# Patient Record
Sex: Female | Born: 1995
Health system: Southern US, Community
[De-identification: ages and names within clinical notes are randomized; demographics above are authoritative.]

## PROBLEM LIST (undated history)

## (undated) DIAGNOSIS — G43909 Migraine, unspecified, not intractable, without status migrainosus: Secondary | ICD-10-CM

## (undated) HISTORY — PX: WISDOM TOOTH EXTRACTION: SHX21

---
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
## 2016-11-13 MED FILL — NUVARING VAGINAL RING: 0.12-0.015 | 84 days supply | Qty: 3 | Fill #0

## 2016-11-18 DIAGNOSIS — Z01419 Encounter for gynecological examination (general) (routine) without abnormal findings: Secondary | ICD-10-CM | POA: Diagnosis not present

## 2016-11-18 DIAGNOSIS — Z6821 Body mass index (BMI) 21.0-21.9, adult: Secondary | ICD-10-CM | POA: Diagnosis not present

## 2016-12-30 DIAGNOSIS — Z0000 Encounter for general adult medical examination without abnormal findings: Secondary | ICD-10-CM | POA: Diagnosis not present

## 2017-03-03 MED FILL — NUVARING VAGINAL RING: 0.12-0.015 | 90 days supply | Qty: 3 | Fill #0

## 2017-05-26 MED FILL — NUVARING VAGINAL RING: 0.12-0.015 | 90 days supply | Qty: 3 | Fill #1

## 2017-08-12 NOTE — Progress Notes (Deleted)
[facility], [address] [place], [place] [address] [phone]  Date:  08/14/2017   Name:  Carly Key   DOB:  1996/01/20   MRN:  [id number]  PCP:  Aggie Hacker, MD    Chief Complaint: No chief complaint on file.   History of Present Illness:  Carly Key is a 22 y.o. very pleasant female patient who presents with the following:  Here today as a new patient to establish care No recent primary care notes in chart   Pap: Labs:    Patient Active Problem List   Diagnosis Date Noted  . Low back pain 10/21/2013    No past medical history on file.  Past Surgical History:  Procedure Laterality Date  . WISDOM TOOTH EXTRACTION      Social History   Tobacco Use  . Smoking status: Never Smoker  . Smokeless tobacco: Never Used  Substance Use Topics  . Alcohol use: No  . Drug use: No    Family History  Problem Relation Age of Onset  . Diabetes Other   . Diabetes Other     No Known Allergies  Medication list has been reviewed and updated.  Current Outpatient Medications on File Prior to Visit  Medication Sig Dispense Refill  . ibuprofen (ADVIL,MOTRIN) 200 MG tablet Take 200 mg by mouth every 6 (six) hours as needed.     No current facility-administered medications on file prior to visit.     Review of Systems:  As per HPI- otherwise negative.   Physical Examination: There were no vitals filed for this visit. There were no vitals filed for this visit. There is no height or weight on file to calculate BMI. Ideal Body Weight:    GEN: WDWN, NAD, Non-toxic, A & O x 3 HEENT: Atraumatic, Normocephalic. Neck supple. No masses, No LAD. Ears and Nose: No external deformity. CV: RRR, No M/G/R. No JVD. No thrill. No extra heart sounds. PULM: CTA B, no wheezes, crackles, rhonchi. No retractions. No resp. distress. No accessory muscle use. ABD: S, NT, ND, +BS. No rebound. No HSM. EXTR: No c/c/e NEURO  Normal gait.  PSYCH: Normally interactive. Conversant. Not depressed or anxious appearing.  Calm demeanor.    Assessment and Plan: ***  Signed Abbe Amsterdam, MD

## 2017-08-13 MED FILL — NUVARING VAGINAL RING: 0.12-0.015 | 90 days supply | Qty: 3 | Fill #2

## 2017-08-14 ENCOUNTER — Ambulatory Visit: Payer: No Typology Code available for payment source | Admitting: Family Medicine

## 2017-08-14 ENCOUNTER — Telehealth: Payer: Self-pay | Admitting: General Practice

## 2017-08-14 DIAGNOSIS — Z0289 Encounter for other administrative examinations: Secondary | ICD-10-CM

## 2017-08-14 NOTE — Telephone Encounter (Signed)
DO NOT SCHEDULE NEW PATIENT APPOINTMENT WITH J. COPLAND LPBC-SW

## 2017-10-31 MED FILL — NUVARING VAGINAL RING: 0.12-0.015 | 90 days supply | Qty: 3 | Fill #3

## 2018-01-28 MED FILL — NUVARING VAGINAL RING: 0.12-0.015 | 90 days supply | Qty: 3 | Fill #0

## 2018-03-26 ENCOUNTER — Telehealth: Payer: Self-pay

## 2018-03-26 NOTE — Telephone Encounter (Signed)
Author phoned pt. To notify her about new patient appointment scheduled per Dr. Cyndie Chimeopland's approval, no answer, unable to leave VM. Routed to scheduler to re-attempt contact.

## 2018-03-29 NOTE — Progress Notes (Addendum)
[facility] [address], [address] [place], [place] [address] [phone] [phone]  Date:  03/30/2018   Name:  Carly PhoChesney Fitting   DOB:  01/26/1996   MRN:  [id number]  PCP:  Pearline Cablesopland, Jessica C, MD    Chief Complaint: New Patient (Initial Visit) (no concerns, flu shot)   History of Present Illness:  Carly Key is a 22 y.o. 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 is studying anthropology and is applying to physical therapy school  She is very busy with school and track, and she plays the violin as well She has 2 sibs In her free time she enjoys reading, art, spending time with her friends  She has been generally healthy over her life  She has no real concerns today  We can get routine labs for her She did get a pap per her GYN, normal  Her immun are UTD, did gardasil  She only had 1 dose of meningitis B, but declines a booster today She no longer lives in the dorm Flu shot today    We noted that her pulse is a bit faster than I would expect for an athlete, she states that pulse in the 90s is normal for her.  She does monitor this on her Fitbit. No problems with athletic performance, no chest pain or shortness of breath, no syncope Patient Active Problem List   Diagnosis Date Noted  . Low back pain 10/21/2013    No past medical history on file.  Past Surgical History:  Procedure Laterality Date  . WISDOM TOOTH EXTRACTION      Social History   Tobacco Use  . Smoking status: Never Smoker  . Smokeless tobacco: Never Used  Substance Use Topics  . Alcohol use: Yes    Comment: occasinally  . Drug use: No    Family History  Problem Relation Age of Onset  . Diabetes Other   . Diabetes Other     No Known Allergies  Medication list has been reviewed  and updated.  Current Outpatient Medications on File Prior to Visit  Medication Sig Dispense Refill  . ibuprofen (ADVIL,MOTRIN) 200 MG tablet Take 200 mg by mouth every 6 (six) hours as needed.     No current facility-administered medications on file prior to visit.     Review of Systems:  As per HPI- otherwise negative. No CP or SOB   Physical Examination: Vitals:   03/30/18 1603  BP: 116/84  Pulse: (!) 108  Resp: 16  SpO2: 99%   Vitals:   03/30/18 1603  Weight: 119 lb (54 kg)  Height: 5\' 3"  (1.6 m)   Body mass index is 21.08 kg/m. Ideal Body Weight: Weight in (lb) to have BMI = 25: 140.8  GEN: WDWN, NAD, Non-toxic, A & O x 3, slim build, looks well HEENT: Atraumatic, Normocephalic. Neck supple. No masses, No LAD. Ears and Nose: No external deformity. CV: RRR, No M/G/R. No JVD. No thrill. No extra heart sounds. PULM: CTA B, no wheezes, crackles, rhonchi. No retractions. No resp. distress. No accessory muscle use. ABD: S, NT, ND, +BS. No rebound. No HSM. EXTR: No c/c/e NEURO Normal gait.  PSYCH: Normally interactive. Conversant. Not depressed or anxious appearing.  Calm demeanor.  Assessment and Plan: Screening for deficiency anemia - Plan: CBC  Screening for hyperlipidemia - Plan: Lipid panel  Screening for thyroid disorder - Plan: TSH  Screening for diabetes mellitus - Plan: Comprehensive metabolic panel  Need for influenza vaccination - Plan: Flu Vaccine QUAD 6+ mos PF IM (Fluarix Quad PF)  New patient to establish care today. Gave flu shot today. Obtain screening labs as above, will be in touch of these reports  Signed Abbe AmsterdamJessica Copland, MD  Received her labs, letter to pt 12/28 Results for orders placed or performed in visit on 03/30/18  CBC  Result Value Ref Range   WBC 7.3 4.0 - 10.5 K/uL   RBC 4.70 3.87 - 5.11 Mil/uL   Platelets 222.0 150.0 - 400.0 K/uL   Hemoglobin 15.2 (H) 12.0 - 15.0 g/dL   HCT 16.144.5 09.636.0 - 04.546.0 %   MCV 94.7 78.0 - 100.0  fl   MCHC 34.1 30.0 - 36.0 g/dL   RDW 40.913.0 81.111.5 - 91.415.5 %  Comprehensive metabolic panel  Result Value Ref Range   Sodium 139 135 - 145 mEq/L   Potassium 4.6 3.5 - 5.1 mEq/L   Chloride 103 96 - 112 mEq/L   CO2 26 19 - 32 mEq/L   Glucose, Bld 80 70 - 99 mg/dL   BUN 9 6 - 23 mg/dL   Creatinine, Ser 7.820.92 0.40 - 1.20 mg/dL   Total Bilirubin 0.7 0.2 - 1.2 mg/dL   Alkaline Phosphatase 49 39 - 117 U/L   AST 13 0 - 37 U/L   ALT 9 0 - 35 U/L   Total Protein 7.1 6.0 - 8.3 g/dL   Albumin 4.1 3.5 - 5.2 g/dL   Calcium 95.610.2 8.4 - 21.310.5 mg/dL   GFR 08.6598.43 >78.46>60.00 mL/min  Lipid panel  Result Value Ref Range   Cholesterol 195 0 - 200 mg/dL   Triglycerides 96.269.0 0.0 - 149.0 mg/dL   HDL 95.2871.80 >41.32>39.00 mg/dL   VLDL 44.013.8 0.0 - 10.240.0 mg/dL   LDL Cholesterol 725110 (H) 0 - 99 mg/dL   Total CHOL/HDL Ratio 3    NonHDL 123.43   TSH  Result Value Ref Range   TSH 1.02 0.35 - 4.50 uIU/mL

## 2018-03-30 ENCOUNTER — Ambulatory Visit (INDEPENDENT_AMBULATORY_CARE_PROVIDER_SITE_OTHER): Payer: No Typology Code available for payment source | Admitting: Family Medicine

## 2018-03-30 ENCOUNTER — Encounter: Payer: Self-pay | Admitting: Family Medicine

## 2018-03-30 VITALS — BP 116/84 | HR 90 | Resp 16 | Ht 63.0 in | Wt 119.0 lb

## 2018-03-30 DIAGNOSIS — Z131 Encounter for screening for diabetes mellitus: Secondary | ICD-10-CM

## 2018-03-30 DIAGNOSIS — Z23 Encounter for immunization: Secondary | ICD-10-CM

## 2018-03-30 DIAGNOSIS — Z1329 Encounter for screening for other suspected endocrine disorder: Secondary | ICD-10-CM | POA: Diagnosis not present

## 2018-03-30 DIAGNOSIS — Z130 Encounter for screening for diseases of the blood and blood-forming organs and certain disorders involving the immune mechanism: Secondary | ICD-10-CM | POA: Diagnosis not present

## 2018-03-30 DIAGNOSIS — Z13220 Encounter for screening for lipoid disorders: Secondary | ICD-10-CM | POA: Diagnosis not present

## 2018-03-30 NOTE — Patient Instructions (Signed)
It was certainly a pleasure to meet you today- have a wonderful holiday break  I will be in touch with your labs asap Your pulse rate is in normal range.

## 2018-03-31 LAB — COMPREHENSIVE METABOLIC PANEL
ALT: 9 U/L (ref 0–35)
AST: 13 U/L (ref 0–37)
Albumin: 4.1 g/dL (ref 3.5–5.2)
Alkaline Phosphatase: 49 U/L (ref 39–117)
BUN: 9 mg/dL (ref 6–23)
CALCIUM: 10.2 mg/dL (ref 8.4–10.5)
CHLORIDE: 103 meq/L (ref 96–112)
CO2: 26 meq/L (ref 19–32)
CREATININE: 0.92 mg/dL (ref 0.40–1.20)
GFR: 98.43 mL/min (ref >60.00)
Glucose, Bld: 80 mg/dL (ref 70–99)
POTASSIUM: 4.6 meq/L (ref 3.5–5.1)
Sodium: 139 mEq/L (ref 135–145)
Total Bilirubin: 0.7 mg/dL (ref 0.2–1.2)
Total Protein: 7.1 g/dL (ref 6.0–8.3)

## 2018-03-31 LAB — LIPID PANEL
CHOL/HDL RATIO: 3
Cholesterol: 195 mg/dL (ref 0–200)
HDL: 71.8 mg/dL (ref >39.00)
LDL CALC: 110 mg/dL — AB (ref 0–99)
NonHDL: 123.43
TRIGLYCERIDES: 69 mg/dL (ref 0.0–149.0)
VLDL: 13.8 mg/dL (ref 0.0–40.0)

## 2018-03-31 LAB — CBC
HCT: 44.5 % (ref 36.0–46.0)
Hemoglobin: 15.2 g/dL — ABNORMAL HIGH (ref 12.0–15.0)
MCHC: 34.1 g/dL (ref 30.0–36.0)
MCV: 94.7 fl (ref 78.0–100.0)
PLATELETS: 222 10*3/uL (ref 150.0–400.0)
RBC: 4.7 Mil/uL (ref 3.87–5.11)
RDW: 13 % (ref 11.5–15.5)
WBC: 7.3 10*3/uL (ref 4.0–10.5)

## 2018-03-31 LAB — TSH: TSH: 1.02 u[IU]/mL (ref 0.35–4.50)

## 2018-04-03 ENCOUNTER — Telehealth: Payer: Self-pay | Admitting: *Deleted

## 2018-04-03 NOTE — Telephone Encounter (Signed)
Received notice back that patient was not "an active patient" at [facility],  as a response to request for Medical Records; forwarded to provider/SLS 12/27

## 2018-04-11 ENCOUNTER — Telehealth: Payer: Self-pay | Admitting: Family Medicine

## 2018-04-11 NOTE — Telephone Encounter (Signed)
Error

## 2018-04-20 MED FILL — NUVARING VAGINAL RING: 0.12-0.015 | 90 days supply | Qty: 3 | Fill #1

## 2018-06-19 ENCOUNTER — Telehealth: Payer: No Typology Code available for payment source | Admitting: Family

## 2018-06-19 DIAGNOSIS — R509 Fever, unspecified: Secondary | ICD-10-CM | POA: Diagnosis not present

## 2018-06-19 NOTE — Progress Notes (Signed)
Greater than 5 minutes, yet less than 10 minutes of time have been spent researching, coordinating, and implementing care for this patient today.  Thank you for the details you included in the comment boxes. Those details are very helpful in determining the best course of treatment for you and help [place] to provide the best care.  Given that you only have a high fever and back pain, this could be numerous other health conditions including a kidney/bladder infection as well. Without the respiratory and other flu symptoms, this is less likely to be the flu. Please monitor this closely and let us know if you develop any of the respiratory symptoms common with the flu.Please carefully observe for any trouble with your bladder/bowels/urinary habits that would be concerning. If so, go to an urgent care ASAP.  E visit for Flu like symptoms   We are sorry that you are not feeling well.  Here is how we plan to help! Based on what you have shared with me it looks like you may have flu-like symptoms that should be watched but do not seem to indicate anti-viral treatment.  Influenza or "the flu" is   an infection caused by a respiratory virus. The flu virus is highly contagious and persons who did not receive their yearly flu vaccination may "catch" the flu from close contact.  We have anti-viral medications to treat the viruses that cause this infection. They are not a "cure" and only shorten the course of the infection. These prescriptions are most effective when they are given within the first 2 days of "flu" symptoms. Antiviral medication are indicated if you have a high risk of complications from the flu. You should  also consider an antiviral medication if you are in close contact with someone who is at risk. These medications can help patients avoid complications from the flu  but have side effects that you should know. Possible side effects from Tamiflu or oseltamivir include nausea, vomiting, diarrhea,  dizziness, headaches, eye redness, sleep problems or other respiratory symptoms. You should not take Tamiflu if you have an allergy to oseltamivir or any to the ingredients in Tamiflu.  Based upon your symptoms and potential risk factors I recommend that you follow the flu symptoms recommendation that I have listed below.  ANYONE WHO HAS FLU SYMPTOMS SHOULD: . Stay home. The flu is highly contagious and going out or to work exposes others! . Be sure to drink plenty of fluids. Water is fine as well as fruit juices, sodas and electrolyte beverages. You may want to stay away from caffeine or alcohol. If you are nauseated, try taking small sips of liquids. How do you know if you are getting enough fluid? Your urine should be a pale yellow or almost colorless. . Get rest. . Taking a steamy shower or using a humidifier may help nasal congestion and ease sore throat pain. Using a saline nasal spray works much the same way. . Cough drops, hard candies and sore throat lozenges may ease your cough. . Line up a caregiver. Have someone check on you regularly.   GET HELP RIGHT AWAY IF: . You cannot keep down liquids or your medications. . You become short of breath . Your fell like you are going to pass out or loose consciousness. . Your symptoms persist after you have completed your treatment plan MAKE SURE YOU   Understand these instructions.  Will watch your condition.  Will get help right away if you are not doing well  or get worse.  Your e-visit answers were reviewed by a board certified advanced clinical practitioner to complete your personal care plan.  Depending on the condition, your plan could have included both over the counter or prescription medications.  If there is a problem please reply  once you have received a response from your provider.  Your safety is important to [place].  If you have drug allergies check your prescription carefully.    You can use MyChart to ask questions about  today's visit, request a non-urgent call back, or ask for a work or school excuse for 24 hours related to this e-Visit. If it has been greater than 24 hours you will need to follow up with your provider, or enter a new e-Visit to address those concerns.  You will get an e-mail in the next two days asking about your experience.  I hope that your e-visit has been valuable and will speed your recovery. Thank you for using e-visits.

## 2018-06-20 ENCOUNTER — Other Ambulatory Visit: Payer: Self-pay

## 2018-06-20 ENCOUNTER — Emergency Department (HOSPITAL_BASED_OUTPATIENT_CLINIC_OR_DEPARTMENT_OTHER)
Admission: EM | Admit: 2018-06-20 | Discharge: 2018-06-21 | Disposition: A | Discharge status: Home / Self Care | Payer: No Typology Code available for payment source | Attending: Emergency Medicine | Admitting: Emergency Medicine

## 2018-06-20 ENCOUNTER — Emergency Department (HOSPITAL_BASED_OUTPATIENT_CLINIC_OR_DEPARTMENT_OTHER): Payer: No Typology Code available for payment source

## 2018-06-20 ENCOUNTER — Encounter: Payer: Self-pay | Admitting: Family Medicine

## 2018-06-20 ENCOUNTER — Encounter (HOSPITAL_BASED_OUTPATIENT_CLINIC_OR_DEPARTMENT_OTHER): Payer: Self-pay | Admitting: *Deleted

## 2018-06-20 DIAGNOSIS — N12 Tubulo-interstitial nephritis, not specified as acute or chronic: Secondary | ICD-10-CM | POA: Insufficient documentation

## 2018-06-20 DIAGNOSIS — R109 Unspecified abdominal pain: Secondary | ICD-10-CM | POA: Insufficient documentation

## 2018-06-20 DIAGNOSIS — M542 Cervicalgia: Secondary | ICD-10-CM | POA: Insufficient documentation

## 2018-06-20 DIAGNOSIS — R51 Headache: Secondary | ICD-10-CM | POA: Diagnosis not present

## 2018-06-20 DIAGNOSIS — R519 Headache, unspecified: Secondary | ICD-10-CM

## 2018-06-20 DIAGNOSIS — H53149 Visual discomfort, unspecified: Secondary | ICD-10-CM | POA: Diagnosis not present

## 2018-06-20 DIAGNOSIS — R509 Fever, unspecified: Secondary | ICD-10-CM | POA: Diagnosis present

## 2018-06-20 HISTORY — DX: Migraine, unspecified, not intractable, without status migrainosus: G43.909

## 2018-06-20 LAB — COMPREHENSIVE METABOLIC PANEL
ALT: 15 U/L (ref 0–44)
ANION GAP: 11 (ref 5–15)
AST: 18 U/L (ref 15–41)
Albumin: 3.5 g/dL (ref 3.5–5.0)
Alkaline Phosphatase: 70 U/L (ref 38–126)
BUN: 13 mg/dL (ref 6–20)
CHLORIDE: 99 mmol/L (ref 98–111)
CO2: 21 mmol/L — ABNORMAL LOW (ref 22–32)
Calcium: 9.2 mg/dL (ref 8.9–10.3)
Creatinine, Ser: 1.23 mg/dL — ABNORMAL HIGH (ref 0.44–1.00)
Glucose, Bld: 107 mg/dL — ABNORMAL HIGH (ref 70–99)
POTASSIUM: 3.6 mmol/L (ref 3.5–5.1)
Sodium: 131 mmol/L — ABNORMAL LOW (ref 135–145)
Total Bilirubin: 0.7 mg/dL (ref 0.3–1.2)
Total Protein: 8.2 g/dL — ABNORMAL HIGH (ref 6.5–8.1)

## 2018-06-20 LAB — CBC WITH DIFFERENTIAL/PLATELET
ABS IMMATURE GRANULOCYTES: 0.12 10*3/uL — AB (ref 0.00–0.07)
BASOS PCT: 0 %
Basophils Absolute: 0 10*3/uL (ref 0.0–0.1)
EOS ABS: 0 10*3/uL (ref 0.0–0.5)
EOS PCT: 0 %
HEMATOCRIT: 47.4 % — AB (ref 36.0–46.0)
HEMOGLOBIN: 15.7 g/dL — AB (ref 12.0–15.0)
Immature Granulocytes: 1 %
LYMPHS PCT: 6 %
Lymphs Abs: 0.7 10*3/uL (ref 0.7–4.0)
MCH: 30.9 pg (ref 26.0–34.0)
MCHC: 33.1 g/dL (ref 30.0–36.0)
MCV: 93.3 fL (ref 80.0–100.0)
Monocytes Absolute: 1 10*3/uL (ref 0.1–1.0)
Monocytes Relative: 8 %
NEUTROS PCT: 85 %
NRBC: 0 % (ref 0.0–0.2)
Neutro Abs: 10.6 10*3/uL — ABNORMAL HIGH (ref 1.7–7.7)
Platelets: 188 10*3/uL (ref 150–400)
RBC: 5.08 MIL/uL (ref 3.87–5.11)
RDW: 12 % (ref 11.5–15.5)
WBC: 12.4 10*3/uL — AB (ref 4.0–10.5)

## 2018-06-20 LAB — URINALYSIS, ROUTINE W REFLEX MICROSCOPIC
Bilirubin Urine: NEGATIVE
GLUCOSE, UA: NEGATIVE mg/dL
Ketones, ur: 15 mg/dL — AB
Nitrite: POSITIVE — AB
PH: 6 (ref 5.0–8.0)
Protein, ur: 100 mg/dL — AB
SPECIFIC GRAVITY, URINE: 1.015 (ref 1.005–1.030)

## 2018-06-20 LAB — URINALYSIS, MICROSCOPIC (REFLEX)

## 2018-06-20 LAB — PREGNANCY, URINE: Preg Test, Ur: NEGATIVE

## 2018-06-20 MED ORDER — SODIUM CHLORIDE 0.9 % IV SOLN
1.0000 g | Freq: Once | INTRAVENOUS | Status: AC
  Administered 2018-06-21: 1 g via INTRAVENOUS
  Filled 2018-06-20: qty 10

## 2018-06-20 MED ORDER — ACETAMINOPHEN 500 MG PO TABS
1000.0000 mg | ORAL_TABLET | Freq: Once | ORAL | Status: AC | PRN
  Administered 2018-06-20: 1000 mg via ORAL
  Filled 2018-06-20: qty 2

## 2018-06-20 MED ORDER — SODIUM CHLORIDE 0.9 % IV BOLUS
1000.0000 mL | Freq: Once | INTRAVENOUS | Status: AC
  Administered 2018-06-20: 1000 mL via INTRAVENOUS

## 2018-06-20 MED ORDER — PROMETHAZINE HCL 25 MG/ML IJ SOLN
INTRAMUSCULAR | Status: AC
  Filled 2018-06-20: qty 1

## 2018-06-20 MED ORDER — PROMETHAZINE HCL 25 MG/ML IJ SOLN
12.5000 mg | Freq: Once | INTRAMUSCULAR | Status: AC
  Administered 2018-06-20: 12.5 mg via INTRAVENOUS

## 2018-06-20 NOTE — ED Provider Notes (Signed)
I assumed care of this patient from Dr. Fredderick Phenix at 0000.  Please see their note for further details of Hx, PE.  Briefly patient is a 23 y.o. female who presented with fever, headache and neck pain.  Work-up remarkable for evidence of pyelonephritis however ruling out meningitis.  Plan is to follow-up on CSF analysis..   If CSF is not suspicious for meningitis, Dr. Fredderick Phenix feels patient is appropriate for discharge home with pyelonephritis treatment.  CSF is reassuring and not suggestive of bacterial meningitis.  Patient was able to tolerate oral intake.  After Dr. Christoper Fabian initial treatment her headache has significantly improved.  The patient appears reasonably screened and/or stabilized for discharge and I doubt any other medical condition or other [facility] requiring further screening, evaluation, or treatment in the ED at this time prior to discharge.  The patient is safe for discharge with strict return precautions.  Disposition: Discharge  Condition: Good  I have discussed the results, Dx and Tx plan with the patient and mother who expressed understanding and agree(s) with the plan. Discharge instructions discussed at great length. The patient and mother were given strict return precautions who verbalized understanding of the instructions. No further questions at time of discharge.    ED Discharge Orders         Ordered    cephALEXin (KEFLEX) 500 MG capsule  3 times daily     06/21/18 0158    ondansetron (ZOFRAN ODT) 4 MG disintegrating tablet  Every 8 hours PRN     06/21/18 0158           Follow Up: Pearline Cables, MD [address] [place] [address] [phone]   in 3-5 days, If symptoms do not improve or  worsen      Oceana Walthall, Amadeo Garnet, MD 06/21/18 0159

## 2018-06-20 NOTE — Progress Notes (Signed)
[facility] [address] [place], [place] [address] [phone]  Date:  06/22/2018   Name:  Carly Key   DOB:  Aug 05, 1995   MRN:  [id number]  PCP:  Carly Cables, MD    Chief Complaint: Pyelonephritis (er follow up,m 3/14, feeling some better, still havign fever, headache)   History of Present Illness:  Carly Key is a 23 y.o. 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 Her energy level is better, but she is still feeling weak and fatigued No vomiting She has not noted a fever today Eating ok  No significant back or abdominal pain  She did take ibuprofen this am about 4 hours ago  Here today with her dad, who contributes to the history Her mom Carly Key is 1 of our pharmacist here at the med center  Patient Active Problem List   Diagnosis Date Noted  . Low back pain 10/21/2013    Past Medical History:  Diagnosis Date  . Migraine     Past Surgical History:  Procedure Laterality Date  . WISDOM TOOTH EXTRACTION      Social History   Tobacco Use  . Smoking status: Never Smoker  . Smokeless tobacco: Never Used  Substance Use Topics  . Alcohol use: Yes    Comment: occasinally  . Drug use: No    Family History  Problem Relation Age of Onset  . Diabetes Other   . Diabetes Other     No Known Allergies  Medication list has been reviewed and updated.  Current Outpatient Medications on File Prior to Visit  Medication Sig Dispense Refill  . cephALEXin (KEFLEX) 500 MG capsule Take 1 capsule  (500 mg total) by mouth 3 (three) times daily for 10 days. 30 capsule 0  . ibuprofen (ADVIL,MOTRIN) 200 MG tablet Take 200 mg by mouth every 6 (six) hours as needed.    . ondansetron (ZOFRAN ODT) 4 MG disintegrating tablet Take 1 tablet (4 mg total) by mouth every 8 (eight) hours as needed for up to 3 days for nausea or vomiting. 15 tablet 0   No current facility-administered medications on file prior to visit.     Review of Systems:  As per HPI- otherwise negative.  Physical Examination: Vitals:   06/22/18 1420  BP: 114/78  Pulse: 89  Resp: 16  Temp: 98.1 F (36.7 C)  SpO2: 99%   Vitals:   06/22/18 1420  Weight: 120 lb (54.4 kg)   Body mass index is 21.26 kg/m. Ideal Body Weight:    GEN: WDWN, NAD, Non-toxic, A & O x 3, petite build, looks well HEENT: Atraumatic, Normocephalic. Neck supple. No masses, No LAD.  Bilateral TM wnl, oropharynx normal.  PEERL,EOMI.   Ears and Nose: No external deformity. CV: RRR, No M/G/R. No JVD. No thrill. No extra heart sounds. PULM: CTA B, no wheezes, crackles, rhonchi. No retractions. No resp. distress. No accessory muscle use. ABD: S, NT, ND, +BS.  No rebound. No HSM.  Belly is benign, no CVA tenderness EXTR: No c/c/e NEURO Normal gait.  PSYCH: Normally interactive. Conversant. Not depressed or anxious appearing.  Calm demeanor.    Assessment and Plan: Pyelonephritis - Plan: Basic metabolic panel  Hospital discharge follow-up  [name] is following up from ER visit over the weekend.  She was diagnosed with pyelonephritis and is on antibiotics.  She is doing much better.  Await final urine culture and sensitivities We will check a BMP today, as she had hyponatremia and elevated creatinine in the ER  Signed Abbe Amsterdam, MD  Received her labs as follows, message to patient  Results for orders placed or performed in visit on 06/22/18  Basic metabolic panel  Result Value Ref Range   Sodium 135 135 - 145 mEq/L   Potassium 3.6 3.5  - 5.1 mEq/L   Chloride 102 96 - 112 mEq/L   CO2 24 19 - 32 mEq/L   Glucose, Bld 108 (H) 70 - 99 mg/dL   BUN 11 6 - 23 mg/dL   Creatinine, Ser 8.25 0.40 - 1.20 mg/dL   Calcium 8.9 8.4 - 00.3 mg/dL   GFR 70.48 >88.91 mL/min

## 2018-06-20 NOTE — Telephone Encounter (Signed)
Got this message, I was concerned. Called mom home, number does not work Regulatory affairs officer cell, no answer and VM full Called pt cell, LMOM- I think she needs ER eval, ?meningitis Will also reply to [employer]  Pam called back and I spoke to her- recommend ER evaluation right away

## 2018-06-20 NOTE — ED Triage Notes (Signed)
Pt reports fever, headache, neck pain since Thursday. She has been taking ibuprofen and tylenol. Migraine headache today. Pt reports pain when she attempts to touch her chin to her chest. She is a Consulting civil engineer at [employer]

## 2018-06-20 NOTE — ED Provider Notes (Signed)
[facility] EMERGENCY DEPARTMENT Provider Note   CSN: [id number] Arrival date & time: 06/20/18  2051    History   Chief Complaint Chief Complaint  Patient presents with  . Fever  . Neck Pain    HPI Kalilah Eichstadt Dingee is a 23 y.o. female.     Patient is a 23 year old female who presents with fever and headache.  She has a 3-day history of worsening headache associated with fevers up to 102.  Her headache is mostly in the posterior head and radiates down her neck.  She states it hurts when she tries to move her head around.  That causes more pain in her neck.  It does not radiate down her spine.  No numbness or weakness to her extremities.  She does have photophobia.  No nausea or vomiting.  She has some mild pain in her right abdomen.  No urinary symptoms.  No rashes.  No known sick contacts.  No URI symptoms.  She is currently a Consulting civil engineer at [employer].  She does have a history of migraines but has not had one in several years and states that she is never had a headache as severe as this.     Past Medical History:  Diagnosis Date  . Migraine     Patient Active Problem List   Diagnosis Date Noted  . Low back pain 10/21/2013    Past Surgical History:  Procedure Laterality Date  . WISDOM TOOTH EXTRACTION       OB History   No obstetric history on file.      Home Medications    Prior to Admission medications   Medication Sig Start Date End Date Taking? Authorizing Provider  ibuprofen (ADVIL,MOTRIN) 200 MG tablet Take 200 mg by mouth every 6 (six) hours as needed.    [provider]    Family History Family History  Problem Relation Age of Onset  . Diabetes Other   . Diabetes Other     Social History Social History   Tobacco Use  . Smoking status: Never Smoker  . Smokeless tobacco: Never Used  Substance Use Topics  . Alcohol use: Yes    Comment: occasinally  . Drug use: No     Allergies   Patient has no known allergies.    Review of Systems Review of Systems  Constitutional: Positive for chills, fatigue and fever. Negative for diaphoresis.  HENT: Negative for congestion, rhinorrhea and sneezing.   Eyes: Positive for photophobia.  Respiratory: Negative for cough, chest tightness and shortness of breath.   Cardiovascular: Negative for chest pain and leg swelling.  Gastrointestinal: Positive for abdominal pain. Negative for blood in stool, diarrhea, nausea and vomiting.  Genitourinary: Negative for difficulty urinating, flank pain, frequency and hematuria.  Musculoskeletal: Positive for myalgias and neck pain. Negative for arthralgias and back pain.  Skin: Negative for rash.  Neurological: Positive for headaches. Negative for dizziness, speech difficulty, weakness and numbness.     Physical Exam Updated Vital Signs BP 116/82   Pulse (!) 116   Temp (!) 102.6 F (39.2 C) (Oral)   Resp 16   Ht 5\' 3"  (1.6 m)   Wt 54.4 kg   SpO2 100%   BMI 21.26 kg/m   Physical Exam Constitutional:      Appearance: She is well-developed.  HENT:     Head: Normocephalic and atraumatic.     Mouth/Throat:     Mouth: Mucous membranes are moist.  Eyes:  Conjunctiva/sclera: Conjunctivae normal.     Pupils: Pupils are equal, round, and reactive to light.  Neck:     Musculoskeletal: Neck rigidity present.     Comments: Patient has limited range of motion of her neck and cannot touch her chin to her chest. Cardiovascular:     Rate and Rhythm: Normal rate and regular rhythm.     Heart sounds: Normal heart sounds.  Pulmonary:     Effort: Pulmonary effort is normal. No respiratory distress.     Breath sounds: Normal breath sounds. No wheezing or rales.  Chest:     Chest wall: No tenderness.  Abdominal:     General: Bowel sounds are normal.     Palpations: Abdomen is soft.     Tenderness: There is abdominal tenderness (mild tenderness to right mid abdomen). There is no guarding or rebound.  Musculoskeletal: Normal  range of motion.  Lymphadenopathy:     Cervical: No cervical adenopathy.  Skin:    General: Skin is warm and dry.     Findings: No rash.  Neurological:     General: No focal deficit present.     Mental Status: She is alert and oriented to person, place, and time.      ED Treatments / Results  Labs (all labs ordered are listed, but only abnormal results are displayed) Labs Reviewed  COMPREHENSIVE METABOLIC PANEL - Abnormal; Notable for the following components:      Result Value   Sodium 131 (*)    CO2 21 (*)    Glucose, Bld 107 (*)    Creatinine, Ser 1.23 (*)    Total Protein 8.2 (*)    All other components within normal limits  CBC WITH DIFFERENTIAL/PLATELET - Abnormal; Notable for the following components:   WBC 12.4 (*)    Hemoglobin 15.7 (*)    HCT 47.4 (*)    Neutro Abs 10.6 (*)    Abs Immature Granulocytes 0.12 (*)    All other components within normal limits  URINALYSIS, ROUTINE W REFLEX MICROSCOPIC - Abnormal; Notable for the following components:   APPearance CLOUDY (*)    Hgb urine dipstick MODERATE (*)    Ketones, ur 15 (*)    Protein, ur 100 (*)    Nitrite POSITIVE (*)    Leukocytes,Ua SMALL (*)    All other components within normal limits  URINALYSIS, MICROSCOPIC (REFLEX) - Abnormal; Notable for the following components:   Bacteria, UA MANY (*)    All other components within normal limits  URINE CULTURE  CSF CULTURE  GRAM STAIN  PREGNANCY, URINE  CSF CELL COUNT WITH DIFFERENTIAL  CSF CELL COUNT WITH DIFFERENTIAL  GLUCOSE, CSF  PROTEIN, CSF    EKG None  Radiology Ct Head Wo Contrast  Result Date: 06/20/2018 CLINICAL DATA:  Fever and headache for 3 days. Pain all over the head into the neck. Sensitivity to light and sound. Dizziness. EXAM: CT HEAD WITHOUT CONTRAST TECHNIQUE: Contiguous axial images were obtained from the base of the skull through the vertex without intravenous contrast. COMPARISON:  None. FINDINGS: Brain: No evidence of acute  infarction, hemorrhage, hydrocephalus, extra-axial collection or mass lesion/mass effect. Vascular: No hyperdense vessel or unexpected calcification. Skull: Calvarium appears intact. Sinuses/Orbits: Paranasal sinuses and mastoid air cells are clear. Other: None. IMPRESSION: No acute intracranial abnormalities. Electronically Signed   By: Burman Nieves M.D.   On: 06/20/2018 22:49    Procedures .Lumbar Puncture Date/Time: 06/20/2018 11:14 PM Performed by: Rolan Bucco, MD Authorized by: Fredderick Phenix  Shawna Orleans, MD   Consent:    Consent obtained:  Written   Consent given by:  Patient   Risks discussed:  Bleeding, infection, nerve damage, headache and pain   Alternatives discussed:  No treatment Pre-procedure details:    Procedure purpose:  Diagnostic   Preparation: Patient was prepped and draped in usual sterile fashion   Anesthesia (see MAR for exact dosages):    Anesthesia method:  Local infiltration   Local anesthetic:  Lidocaine 1% w/o epi Procedure details:    Lumbar space:  L4-L5 interspace   Patient position:  Sitting   Needle gauge:  22   Needle type:  Diamond point   Needle length (in):  3.5   Ultrasound guidance: no     Number of attempts:  1   Fluid appearance:  Clear   Tubes of fluid:  4   Total volume (ml):  4 Post-procedure:    Puncture site:  Adhesive bandage applied and direct pressure applied   Patient tolerance of procedure:  Tolerated well, no immediate complications   (including critical care time)  Medications Ordered in ED Medications  promethazine (PHENERGAN) 25 MG/ML injection (has no administration in time range)  cefTRIAXone (ROCEPHIN) 1 g in sodium chloride 0.9 % 100 mL IVPB (has no administration in time range)  acetaminophen (TYLENOL) tablet 1,000 mg (1,000 mg Oral Given 06/20/18 2145)  sodium chloride 0.9 % bolus 1,000 mL (1,000 mLs Intravenous New Bag/Given 06/20/18 2300)  promethazine (PHENERGAN) injection 12.5 mg (12.5 mg Intravenous Given 06/20/18  2254)     Initial Impression / Assessment and Plan / ED Course  I have reviewed the triage vital signs and the nursing notes.  Pertinent labs & imaging results that were available during my care of the patient were reviewed by me and considered in my medical decision making (see chart for details).        Patient is a 23 year old female who presents with fever and severe headache which she describes as the worst headache of her life.  She had a head CT which shows no acute abnormalities.  A spinal tap was performed.  Her labs show mild elevation in her creatinine.  She does have evidence of infection in her urine.  She has some mild right side abdominal pain although on reexam she is nontender.  She does not have any urinary symptoms but given her urinalysis and the slight pain in her abdomen, her symptoms could be related to pyelonephritis.  She does have some slight blood in her urine but she does not have symptoms that sound more concerning for renal colic at this point.  She was given IV antibiotics.  Her urine was sent for culture.  Her CSF labs are pending.  Dr. Eudelia Bunch to take over care pending these results.  Final Clinical Impressions(s) / ED Diagnoses   Final diagnoses:  Pyelonephritis    ED Discharge Orders    None       Rolan Bucco, MD 06/20/18 2335

## 2018-06-21 LAB — CSF CELL COUNT WITH DIFFERENTIAL
RBC Count, CSF: 0 /mm3
RBC Count, CSF: 1 /mm3 — ABNORMAL HIGH
TUBE #: 4
Tube #: 1
WBC, CSF: 1 /mm3 (ref 0–5)
WBC, CSF: 1 /mm3 (ref 0–5)

## 2018-06-21 LAB — PROTEIN, CSF: TOTAL PROTEIN, CSF: 13 mg/dL — AB (ref 15–45)

## 2018-06-21 LAB — GLUCOSE, CSF: Glucose, CSF: 66 mg/dL (ref 40–70)

## 2018-06-21 MED ORDER — CEPHALEXIN 500 MG PO CAPS: 500.0000 mg | ORAL_CAPSULE | Freq: Three times a day (TID) | ORAL | 0 refills | Status: AC

## 2018-06-21 MED ORDER — SODIUM CHLORIDE 0.9 % IV SOLN
INTRAVENOUS | Status: DC | PRN
  Administered 2018-06-21: 500 mL via INTRAVENOUS

## 2018-06-21 MED ORDER — ONDANSETRON 4 MG PO TBDP: 4.0000 mg | ORAL_TABLET | Freq: Three times a day (TID) | ORAL | 0 refills | Status: AC | PRN

## 2018-06-21 MED FILL — CEPHALEXIN 500 MG CAPSULE: 500 | 10 days supply | Qty: 30 | Fill #0

## 2018-06-21 MED FILL — ONDANSETRON ODT 4 MG TABLET: 4 | 5 days supply | Qty: 15 | Fill #0

## 2018-06-21 NOTE — ED Notes (Signed)
Pt and mother understood dc material. NAD noted. Scripts given at [facility]. All questions answered to satisfaction. Pt and mother escorted to checkout window.

## 2018-06-22 ENCOUNTER — Other Ambulatory Visit: Payer: Self-pay

## 2018-06-22 ENCOUNTER — Encounter: Payer: Self-pay | Admitting: Family Medicine

## 2018-06-22 ENCOUNTER — Ambulatory Visit (INDEPENDENT_AMBULATORY_CARE_PROVIDER_SITE_OTHER): Payer: No Typology Code available for payment source | Admitting: Family Medicine

## 2018-06-22 VITALS — BP 114/78 | HR 89 | Temp 98.1°F | Resp 16 | Wt 120.0 lb

## 2018-06-22 DIAGNOSIS — N12 Tubulo-interstitial nephritis, not specified as acute or chronic: Secondary | ICD-10-CM

## 2018-06-22 DIAGNOSIS — Z09 Encounter for follow-up examination after completed treatment for conditions other than malignant neoplasm: Secondary | ICD-10-CM

## 2018-06-22 LAB — BASIC METABOLIC PANEL
BUN: 11 mg/dL (ref 6–23)
CO2: 24 mEq/L (ref 19–32)
Calcium: 8.9 mg/dL (ref 8.4–10.5)
Chloride: 102 mEq/L (ref 96–112)
Creatinine, Ser: 1.09 mg/dL (ref 0.40–1.20)
GFR: 76 mL/min (ref >60.00)
GLUCOSE: 108 mg/dL — AB (ref 70–99)
POTASSIUM: 3.6 meq/L (ref 3.5–5.1)
SODIUM: 135 meq/L (ref 135–145)

## 2018-06-22 NOTE — Patient Instructions (Addendum)
It was good to see you today  I am sorry that you got so sick; continue to rest, use your antibiotic Let us know if you don't continue to improve and get better over the next couple of days  We will recheck your basic metabolic profile today

## 2018-06-23 LAB — URINE CULTURE

## 2018-06-24 ENCOUNTER — Telehealth: Payer: Self-pay

## 2018-06-24 LAB — CSF CULTURE W GRAM STAIN
Culture: NO GROWTH
Special Requests: NORMAL

## 2018-06-24 LAB — CSF CULTURE

## 2018-06-24 NOTE — Telephone Encounter (Signed)
Post ED Visit - Positive Culture Follow-up  Culture report reviewed by antimicrobial stewardship pharmacist: [facility] []  Enzo Bi, Pharm.D. []  Celedonio Miyamoto, Pharm.D., BCPS AQ-ID []  Garvin Fila, Pharm.D., BCPS []  Georgina Pillion, [address].D., BCPS []  [place], [address].D., BCPS, AAHIVP []  Estella Husk, Pharm.D., BCPS, AAHIVP [x]  Lysle Pearl, PharmD, BCPS []  Phillips Climes, PharmD, BCPS []  Agapito Games, PharmD, BCPS []  Verlan Friends, PharmD []  Mervyn Gay, PharmD, BCPS []  Vinnie Level, PharmD  [facility] []  Len Childs, PharmD []  Greer Pickerel, PharmD []  Adalberto Cole, PharmD []  Perlie Gold, Rph []  Lonell Face) Jean Rosenthal, PharmD []  Earl Many, PharmD []  Junita Push, PharmD []  Dorna Leitz, PharmD []  Terrilee Files, PharmD []  Lynann Beaver, PharmD []  Keturah Barre, PharmD []  Loralee Pacas, PharmD []  Bernadene Person, PharmD   Positive urine culture Treated with Cephalexin, organism sensitive to the same and no further patient follow-up is required at this time.  Jerry Caras 06/24/2018, 8:22 AM

## 2018-07-02 MED FILL — ETONOGESTREL-ETHINYL ESTRAD: 0.12-0.015 | 90 days supply | Qty: 3 | Fill #2

## 2018-08-14 ENCOUNTER — Telehealth: Payer: Self-pay | Admitting: Family Medicine

## 2018-08-14 ENCOUNTER — Telehealth: Payer: Self-pay

## 2018-08-14 DIAGNOSIS — Z111 Encounter for screening for respiratory tuberculosis: Secondary | ICD-10-CM

## 2018-08-14 NOTE — Telephone Encounter (Signed)
Pt dropped off document to be filled out by provider ( 1 page with Immunization info) Document given to CMA. Pt ok to be called when document ready. Pt's mother work at Pharmacy at [employer] ok to call mother.

## 2018-08-14 NOTE — Telephone Encounter (Signed)
Patient came in to drop off form for school which required quantiferon lab test. Order has been placed as future.

## 2018-08-17 ENCOUNTER — Other Ambulatory Visit: Payer: Self-pay

## 2018-08-17 ENCOUNTER — Other Ambulatory Visit (INDEPENDENT_AMBULATORY_CARE_PROVIDER_SITE_OTHER): Payer: No Typology Code available for payment source

## 2018-08-17 DIAGNOSIS — Z0279 Encounter for issue of other medical certificate: Secondary | ICD-10-CM

## 2018-08-17 DIAGNOSIS — Z111 Encounter for screening for respiratory tuberculosis: Secondary | ICD-10-CM | POA: Diagnosis not present

## 2018-08-19 LAB — QUANTIFERON-TB GOLD PLUS
Mitogen-NIL: >10 IU/mL
NIL: 0.08 IU/mL
QuantiFERON-TB Gold Plus: NEGATIVE
TB1-NIL: <0 IU/mL
TB2-NIL: <0 IU/mL

## 2018-08-22 ENCOUNTER — Encounter: Payer: Self-pay | Admitting: Family Medicine

## 2018-08-26 ENCOUNTER — Ambulatory Visit (INDEPENDENT_AMBULATORY_CARE_PROVIDER_SITE_OTHER): Payer: No Typology Code available for payment source | Admitting: Family Medicine

## 2018-08-26 ENCOUNTER — Encounter: Payer: Self-pay | Admitting: Family Medicine

## 2018-08-26 DIAGNOSIS — N390 Urinary tract infection, site not specified: Secondary | ICD-10-CM

## 2018-08-26 DIAGNOSIS — R8290 Unspecified abnormal findings in urine: Secondary | ICD-10-CM

## 2018-08-26 MED ORDER — CEPHALEXIN 500 MG PO CAPS: 500.0000 mg | ORAL_CAPSULE | Freq: Two times a day (BID) | ORAL | 0 refills | Status: DC

## 2018-08-26 MED FILL — CEPHALEXIN 500 MG CAPSULE: 500 | 7 days supply | Qty: 14 | Fill #0

## 2018-08-26 NOTE — Addendum Note (Signed)
Addended by: Harley Alto on: 08/26/2018 03:10 PM   Modules accepted: Orders

## 2018-08-26 NOTE — Progress Notes (Addendum)
[facility] [address] [place], [place] [address] [phone]  Date:  08/26/2018   Name:  Carly Key   DOB:  Feb 07, 1996   MRN:  [id number]  PCP:  Pearline Cables, MD    Chief Complaint: No chief complaint on file.   History of Present Illness:  Carly Key is a 23 y.o. 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 coli  Patient Active Problem List   Diagnosis Date Noted  . Low back pain 10/21/2013    Past Medical History:  Diagnosis Date  . Migraine     Past Surgical History:  Procedure Laterality Date  . WISDOM TOOTH EXTRACTION      Social History   Tobacco Use  . Smoking status: Never Smoker  . Smokeless tobacco: Never Used  Substance Use Topics  . Alcohol use: Yes    Comment: occasinally  . Drug use: No    Family History  Problem Relation Age of Onset  . Diabetes Other   . Diabetes Other     No Known Allergies  Medication list has been reviewed and updated.  Current Outpatient  Medications on File Prior to Visit  Medication Sig Dispense Refill  . ibuprofen (ADVIL,MOTRIN) 200 MG tablet Take 200 mg by mouth every 6 (six) hours as needed.     No current facility-administered medications on file prior to visit.     Review of Systems:  As per HPI- otherwise negative. No vaginal sx No risk of pregnancy   Physical Examination: There were no vitals filed for this visit. There were no vitals filed for this visit. There is no height or weight on file to calculate BMI. Ideal Body Weight:    Pt looks well over video-no cough, wheezing, tachypnea or distress is observed Not checking her vitals at home  However she does not suspect a fever  Assessment and Plan: Abnormal urine odor - Plan: cephALEXin (KEFLEX) 500 MG capsule  Virtual visit today for concern of UTI.  This patient got quite ill a couple of months ago with pyelonephritis.  Her urine culture at that time showed a pansensitive E. coli.  Thankfully she is not toxic at this time, but we will go ahead and start her on treatment.  I have asked her to bring in a urine culture prior to starting her antibiotic, she plans to do so right away.  Ordered Keflex to her pharmacy, await urine culture  Signed Abbe Amsterdam, MD  Received urine culture  report 5/22- message to pt  Results for orders placed or performed in visit on 08/26/18  Urine Culture  Result Value Ref Range   MICRO NUMBER: [id number]    SPECIMEN QUALITY: Adequate    Sample Source URINE    STATUS: FINAL    ISOLATE 1: Escherichia coli (A)       Susceptibility   Escherichia coli - URINE CULTURE, REFLEX    AMOX/CLAVULANIC <=2 Sensitive     AMPICILLIN <=2 Sensitive     AMPICILLIN/SULBACTAM <=2 Sensitive     CEFAZOLIN* <=4 Not Reportable      * For infections other than uncomplicated UTIcaused by E. coli, K. pneumoniae or P. mirabilis:Cefazolin is resistant if MIC > or = 8 mcg/mL.(Distinguishing susceptible versus intermediatefor isolates with MIC <  or = 4 mcg/mL requiresadditional testing.)For uncomplicated UTI caused by E. coli,K. pneumoniae or P. mirabilis: Cefazolin issusceptible if MIC <32 mcg/mL and predictssusceptible to the oral agents cefaclor, cefdinir,cefpodoxime, cefprozil, cefuroxime, cephalexinand loracarbef.    CEFEPIME <=1 Sensitive     CEFTRIAXONE <=1 Sensitive     CIPROFLOXACIN <=0.25 Sensitive     LEVOFLOXACIN <=0.12 Sensitive     ERTAPENEM <=0.5 Sensitive     GENTAMICIN <=1 Sensitive     IMIPENEM <=0.25 Sensitive     NITROFURANTOIN <=16 Sensitive     PIP/TAZO <=4 Sensitive     TOBRAMYCIN <=1 Sensitive     TRIMETH/SULFA* <=20 Sensitive      * For infections other than uncomplicated UTIcaused by E. coli, K. pneumoniae or P. mirabilis:Cefazolin is resistant if MIC > or = 8 mcg/mL.(Distinguishing susceptible versus intermediatefor isolates with MIC < or = 4 mcg/mL requiresadditional testing.)For uncomplicated UTI caused by E. coli,K. pneumoniae or P. mirabilis: Cefazolin issusceptible if MIC <32 mcg/mL and predictssusceptible to the oral agents cefaclor, cefdinir,cefpodoxime, cefprozil, cefuroxime, cephalexinand loracarbef.Legend:S = Susceptible  I = IntermediateR = Resistant  NS = Not susceptible* = Not tested  NR = Not reported**NN = See antimicrobic comments

## 2018-08-28 ENCOUNTER — Encounter: Payer: Self-pay | Admitting: Family Medicine

## 2018-08-28 LAB — URINE CULTURE
MICRO NUMBER:: 491837
SPECIMEN QUALITY:: ADEQUATE

## 2018-09-24 MED FILL — ETONOGESTREL-ETHINYL ESTRAD: 0.12-0.015 | 28 days supply | Qty: 1 | Fill #0

## 2018-10-06 ENCOUNTER — Telehealth: Payer: Self-pay | Admitting: Family Medicine

## 2018-10-06 DIAGNOSIS — Z205 Contact with and (suspected) exposure to viral hepatitis: Secondary | ICD-10-CM

## 2018-10-06 NOTE — Telephone Encounter (Signed)
Pt came in office stating is needing to have done again for Hepatitis B test for school, if ok with provider and approved so that pt can have orders file and can be schedule for lab appt. Please advise and call pt at [phone].

## 2018-10-07 NOTE — Telephone Encounter (Signed)
Looked in patients NCIR, patient completed Hep B series it is actually Men B that patient needs.If ok I will call patient to set up nurse visit to come in. UTD on all other vaccines.

## 2018-10-08 ENCOUNTER — Encounter: Payer: Self-pay | Admitting: Family Medicine

## 2018-10-08 NOTE — Telephone Encounter (Signed)
I think what she needs is a hep B titer for PT school.  Have ordered this and reached out to pt via mychart

## 2018-10-15 MED FILL — ETONOGESTREL-ETHINYL ESTRAD: 0.12-0.015 | 84 days supply | Qty: 3 | Fill #0

## 2018-10-28 ENCOUNTER — Telehealth: Payer: Self-pay

## 2018-10-28 DIAGNOSIS — Z205 Contact with and (suspected) exposure to viral hepatitis: Secondary | ICD-10-CM

## 2018-10-28 DIAGNOSIS — Z0283 Encounter for blood-alcohol and blood-drug test: Secondary | ICD-10-CM

## 2018-10-28 NOTE — Addendum Note (Signed)
Addended by: Lamar Blinks C on: 10/28/2018 09:09 AM   Modules accepted: Orders

## 2018-10-28 NOTE — Telephone Encounter (Signed)
Ok to schedule lab only visit. Order have been placed.

## 2018-10-28 NOTE — Telephone Encounter (Signed)
I need a Hepatitis B blood titer and drug screening urine test for school this Fall. Could you place the order of the UDS if appropriate I will call to schedule patient a lab visit? Or does patient need OV?

## 2018-10-30 ENCOUNTER — Other Ambulatory Visit (INDEPENDENT_AMBULATORY_CARE_PROVIDER_SITE_OTHER): Payer: No Typology Code available for payment source

## 2018-10-30 ENCOUNTER — Other Ambulatory Visit: Payer: Self-pay

## 2018-10-30 DIAGNOSIS — Z0283 Encounter for blood-alcohol and blood-drug test: Secondary | ICD-10-CM

## 2018-10-30 DIAGNOSIS — Z205 Contact with and (suspected) exposure to viral hepatitis: Secondary | ICD-10-CM

## 2018-10-31 LAB — PAIN MGMT, PROFILE 8 W/CONF, U
6 Acetylmorphine: NEGATIVE ng/mL
Alcohol Metabolites: NEGATIVE ng/mL (ref <500)
Amphetamines: NEGATIVE ng/mL
Benzodiazepines: NEGATIVE ng/mL
Buprenorphine, Urine: NEGATIVE ng/mL
Cocaine Metabolite: NEGATIVE ng/mL
Creatinine: >300 mg/dL
MDMA: NEGATIVE ng/mL
Marijuana Metabolite: NEGATIVE ng/mL
Opiates: NEGATIVE ng/mL
Oxidant: NEGATIVE ug/mL
Oxycodone: NEGATIVE ng/mL
pH: 5.5 (ref 4.5–9.0)

## 2018-11-02 LAB — HEPATITIS B SURFACE ANTIBODY, QUANTITATIVE
Hep B S AB Quant (Post): <5 m[IU]/mL — ABNORMAL LOW (ref >=10)
Hepatitis B-Post: <5 m[IU]/mL — ABNORMAL LOW (ref >=10)

## 2018-11-04 ENCOUNTER — Encounter: Payer: Self-pay | Admitting: Family Medicine

## 2018-11-05 ENCOUNTER — Encounter: Payer: Self-pay | Admitting: Family Medicine

## 2018-11-05 MED FILL — RECOMBIVAX HB 10 MCG/ML VIA: 10 | 1 days supply | Qty: 1 | Fill #0

## 2019-01-04 MED FILL — ETONOGESTREL-ETHINYL ESTRAD: 0.12-0.015 | 84 days supply | Qty: 3 | Fill #1

## 2019-03-30 MED FILL — ETONOGESTREL-ETHINYL ESTRAD: 0.12-0.015 | 84 days supply | Qty: 3 | Fill #2

## 2019-04-07 ENCOUNTER — Other Ambulatory Visit: Payer: Self-pay

## 2019-04-07 DIAGNOSIS — Z20822 Contact with and (suspected) exposure to covid-19: Secondary | ICD-10-CM

## 2019-04-08 LAB — NOVEL CORONAVIRUS, NAA: SARS-CoV-2, NAA: NOT DETECTED

## 2019-07-05 MED FILL — ETONOGESTREL-ETHINYL ESTRAD: 0.12-0.015 | 84 days supply | Qty: 3 | Fill #3

## 2019-09-20 MED FILL — ETONOGESTREL-ETHINYL ESTRAD: 0.12-0.015 | 84 days supply | Qty: 3 | Fill #4

## 2020-01-05 IMAGING — CT CT HEAD WITHOUT CONTRAST
3 series · 16 of 47 positions shown, 19 images · non-contrast
Comparison: None.

CLINICAL DATA: Fever and headache for 3 days. Pain all over the
head into the neck. Sensitivity to light and sound. Dizziness.

EXAM:
CT HEAD WITHOUT CONTRAST
TECHNIQUE: Contiguous axial images were obtained from the base of the skull
through the vertex without intravenous contrast.

[Series 2: head wo · axial · 0.43mm/px · z∈[+770,+900]mm · 10 of 32 slices shown, 13 images]
[im 3/32  brain]
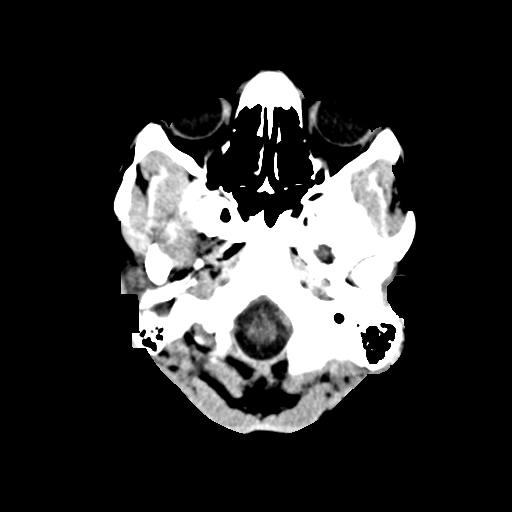
[im 3/32  bone]
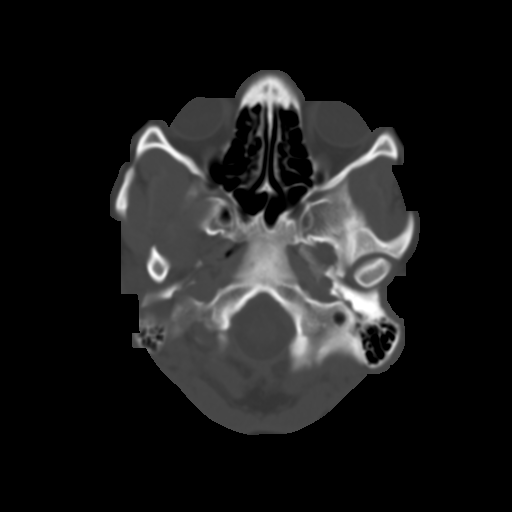
[im 6/32  brain]
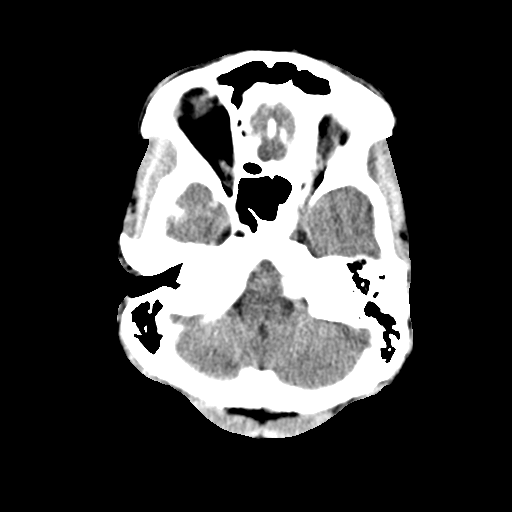
[im 9/32  brain]
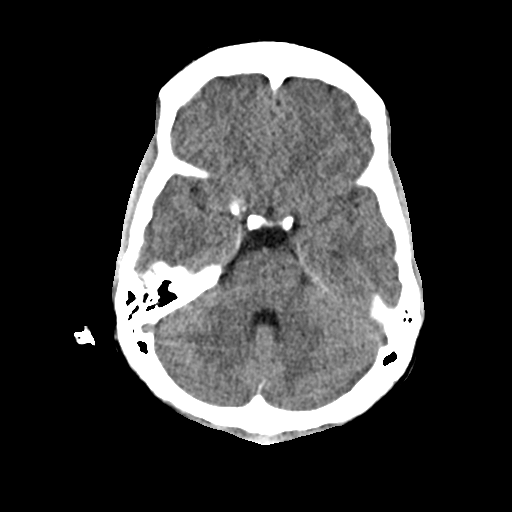
[im 11/32  brain]
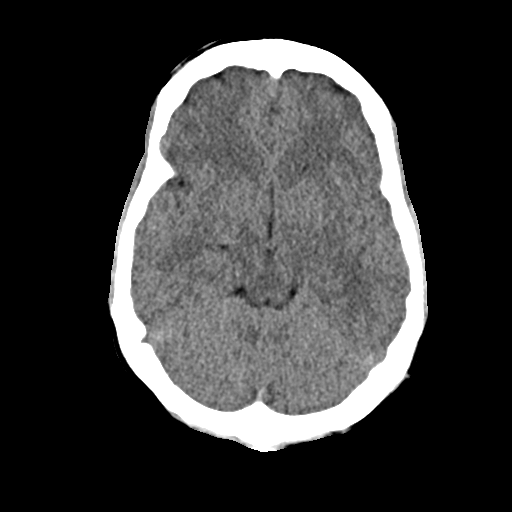
[im 14/32  brain]
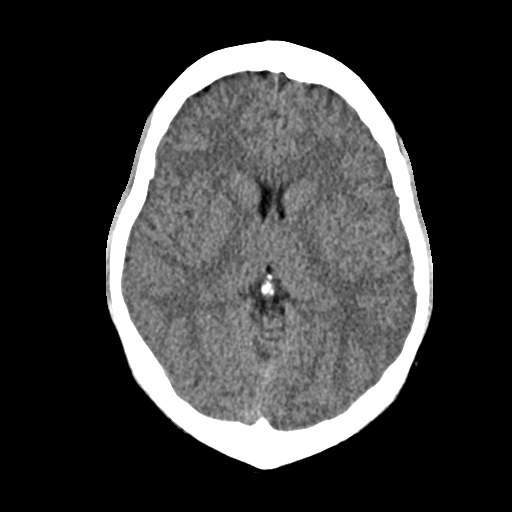
[im 14/32  bone]
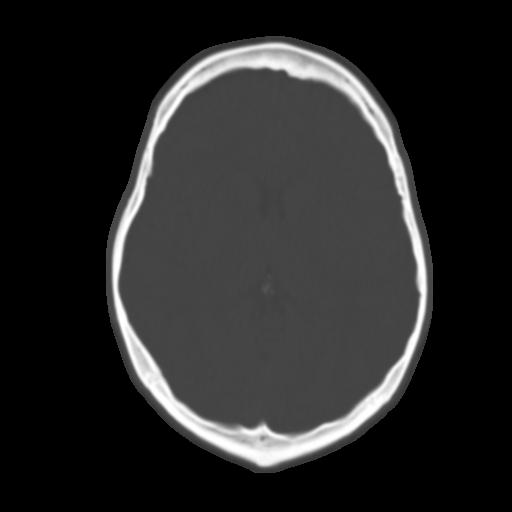
[im 18/32  brain]
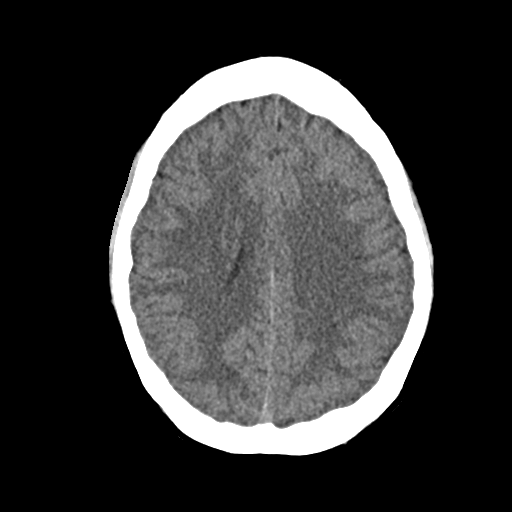
[im 21/32  brain]
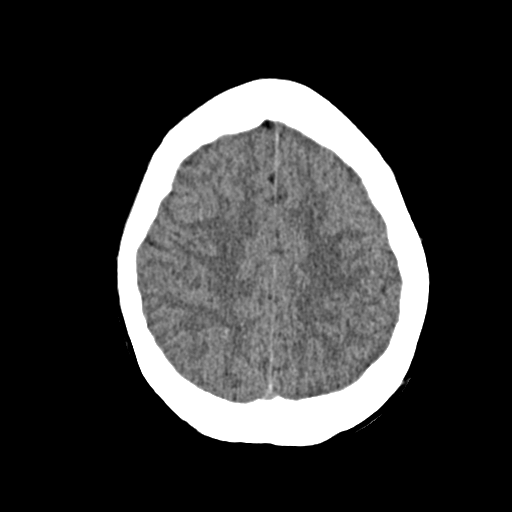
[im 24/32  brain]
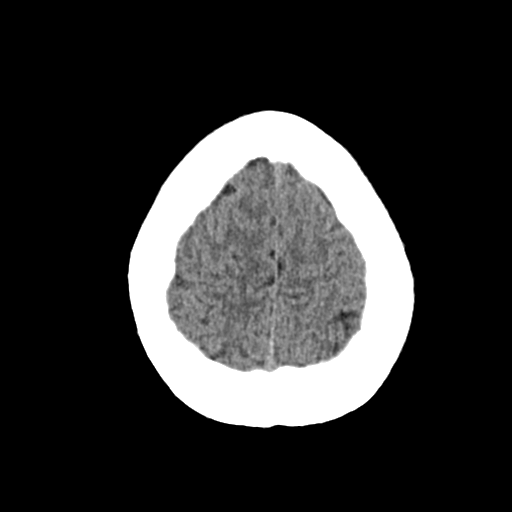
[im 26/32  brain]
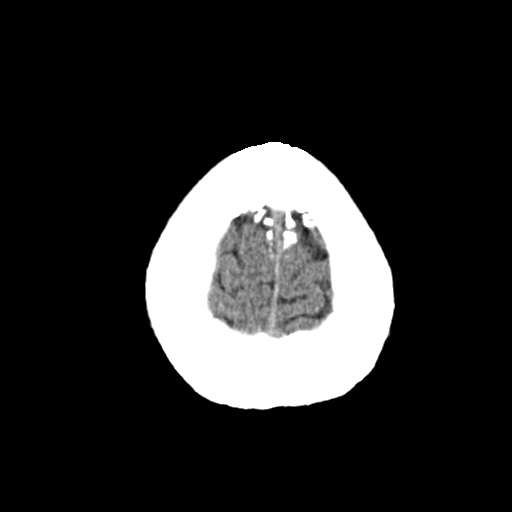
[im 26/32  bone]
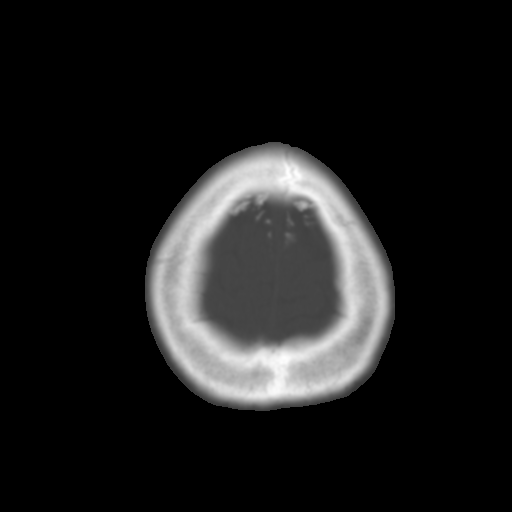
[im 29/32  brain]
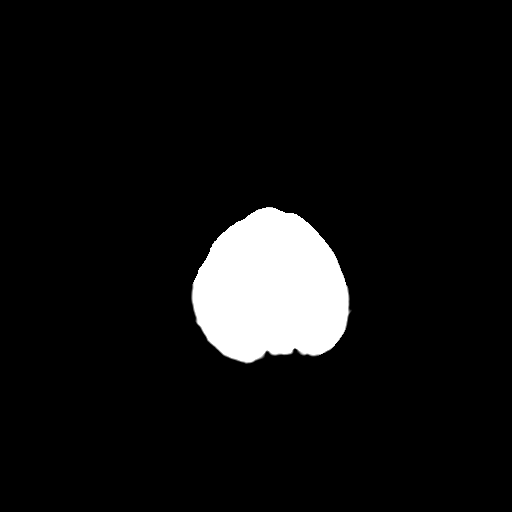

[Series 4: coronal soft · coronal · 0.32mm/px · 3 of 67 slices shown]
[im 23/67  brain]
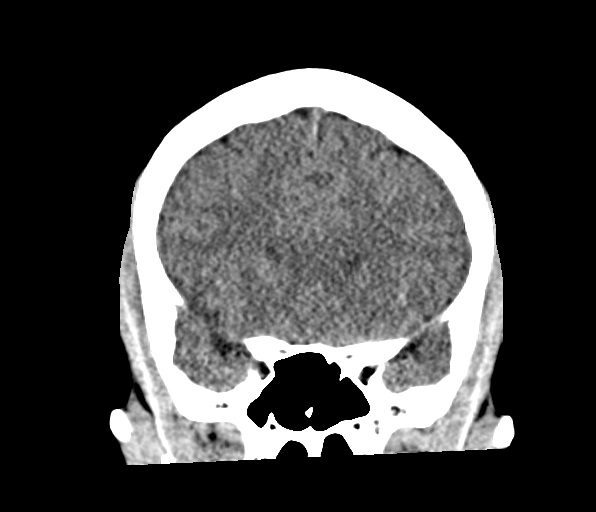
[im 30/67  brain]
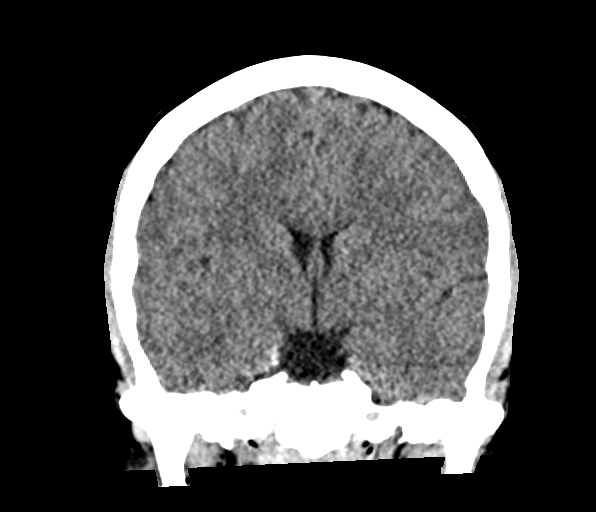
[im 37/67  brain]
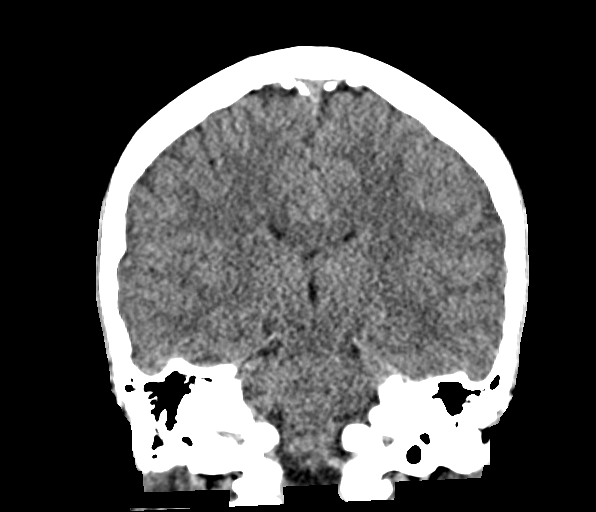

[Series 5: sag soft · sagittal · 0.32mm/px · 3 of 54 slices shown]
[im 18/54  brain]
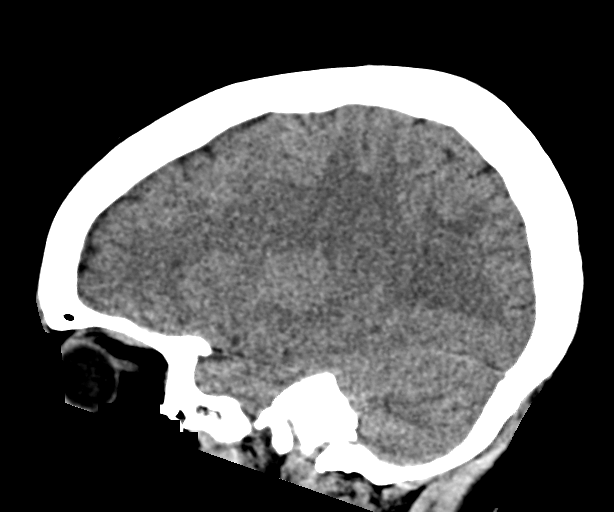
[im 27/54  brain]
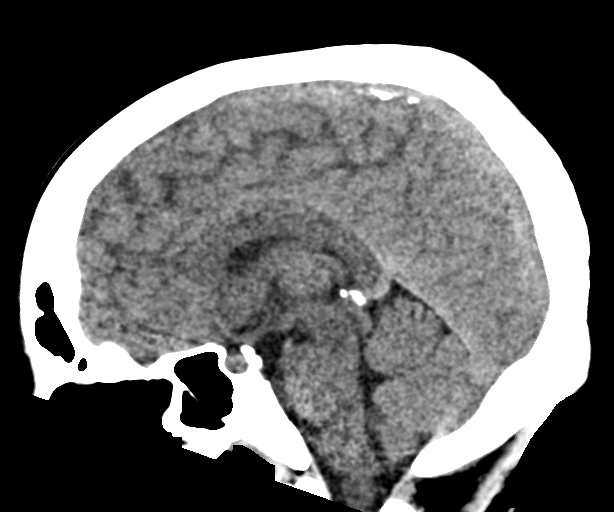
[im 36/54  brain]
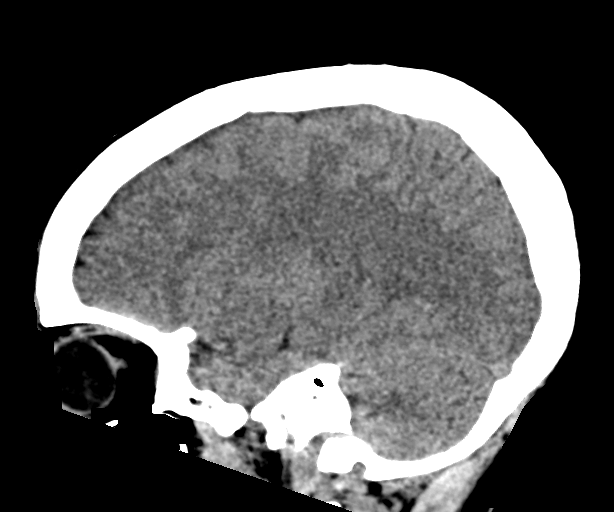

[16 of 47 positions shown; findings below may reference images not displayed]

FINDINGS: Brain: No evidence of acute infarction, hemorrhage, hydrocephalus,
extra-axial collection or mass lesion/mass effect.

Vascular: No hyperdense vessel or unexpected calcification.

Skull: Calvarium appears intact.

Sinuses/Orbits: Paranasal sinuses and mastoid air cells are clear.

Other: None.
IMPRESSION: No acute intracranial abnormalities.

## 2020-01-15 ENCOUNTER — Ambulatory Visit
Admission: EM | Admit: 2020-01-15 | Discharge: 2020-01-15 | Disposition: A | Discharge status: Home / Self Care | Payer: No Typology Code available for payment source | Attending: Emergency Medicine | Admitting: Emergency Medicine

## 2020-01-15 ENCOUNTER — Other Ambulatory Visit: Payer: Self-pay

## 2020-01-15 DIAGNOSIS — R197 Diarrhea, unspecified: Secondary | ICD-10-CM | POA: Diagnosis not present

## 2020-01-15 LAB — CBC WITH DIFFERENTIAL/PLATELET
Abs Immature Granulocytes: 0.01 10*3/uL (ref 0.00–0.07)
Basophils Absolute: 0 10*3/uL (ref 0.0–0.1)
Basophils Relative: 0 %
Eosinophils Absolute: 0 10*3/uL (ref 0.0–0.5)
Eosinophils Relative: 0 %
HCT: 44.8 % (ref 36.0–46.0)
Hemoglobin: 15.6 g/dL — ABNORMAL HIGH (ref 12.0–15.0)
Immature Granulocytes: 0 %
Lymphocytes Relative: 10 %
Lymphs Abs: 0.8 10*3/uL (ref 0.7–4.0)
MCH: 31.8 pg (ref 26.0–34.0)
MCHC: 34.8 g/dL (ref 30.0–36.0)
MCV: 91.4 fL (ref 80.0–100.0)
Monocytes Absolute: 0.5 10*3/uL (ref 0.1–1.0)
Monocytes Relative: 5 %
Neutro Abs: 7.2 10*3/uL (ref 1.7–7.7)
Neutrophils Relative %: 85 %
Platelets: 228 10*3/uL (ref 150–400)
RBC: 4.9 MIL/uL (ref 3.87–5.11)
RDW: 12.5 % (ref 11.5–15.5)
WBC: 8.5 10*3/uL (ref 4.0–10.5)
nRBC: 0 % (ref 0.0–0.2)

## 2020-01-15 LAB — COMPREHENSIVE METABOLIC PANEL
ALT: 14 U/L (ref 0–44)
AST: 18 U/L (ref 15–41)
Albumin: 3.9 g/dL (ref 3.5–5.0)
Alkaline Phosphatase: 49 U/L (ref 38–126)
Anion gap: 12 (ref 5–15)
BUN: 7 mg/dL (ref 6–20)
CO2: 22 mmol/L (ref 22–32)
Calcium: 9.4 mg/dL (ref 8.9–10.3)
Chloride: 102 mmol/L (ref 98–111)
Creatinine, Ser: 0.92 mg/dL (ref 0.44–1.00)
GFR, Estimated: >60 mL/min (ref >60)
Glucose, Bld: 99 mg/dL (ref 70–99)
Potassium: 3.9 mmol/L (ref 3.5–5.1)
Sodium: 136 mmol/L (ref 135–145)
Total Bilirubin: 0.6 mg/dL (ref 0.3–1.2)
Total Protein: 7.7 g/dL (ref 6.5–8.1)

## 2020-01-15 LAB — OCCULT BLOOD X 1 CARD TO LAB, STOOL: Fecal Occult Bld: POSITIVE — AB

## 2020-01-15 MED ORDER — SODIUM CHLORIDE 0.9 % IV BOLUS
1000.0000 mL | Freq: Once | INTRAVENOUS | Status: AC
  Administered 2020-01-15: 1000 mL via INTRAVENOUS

## 2020-01-15 MED ORDER — ONDANSETRON 8 MG PO TBDP: 8.0000 mg | ORAL_TABLET | Freq: Three times a day (TID) | ORAL | 0 refills | Status: AC | PRN

## 2020-01-15 MED ORDER — DICYCLOMINE HCL 20 MG PO TABS: 20.0000 mg | ORAL_TABLET | Freq: Two times a day (BID) | ORAL | 0 refills | Status: AC

## 2020-01-15 MED ORDER — CIPROFLOXACIN HCL 500 MG PO TABS: 500.0000 mg | ORAL_TABLET | Freq: Two times a day (BID) | ORAL | 0 refills | Status: AC

## 2020-01-15 MED ORDER — SODIUM CHLORIDE 0.9 % IV BOLUS: 1000.0000 mL | Freq: Once | INTRAVENOUS | Status: DC

## 2020-01-15 MED ORDER — KETOROLAC TROMETHAMINE 30 MG/ML IJ SOLN
30.0000 mg | Freq: Once | INTRAMUSCULAR | Status: AC
  Administered 2020-01-15: 30 mg via INTRAVENOUS

## 2020-01-15 NOTE — ED Triage Notes (Signed)
Patient states that she has been having vomiting and diarrhea that started on Thursday. Patient states that she had chipotle on Wednesday around 8 pm, she thinks may have been the cause of her symptoms. Patient states that she has been having persistent diarrhea and now is only having bloody stools. Patient states that she is also having stomach cramps and vomiting only occurs while defecating now. Patient states that she did try to take an anti-diarrheal medication last night which allowed for some sleep. States that she is typically going every 15-30 mins.

## 2020-01-15 NOTE — ED Provider Notes (Signed)
[facility]    CSN: [id number] Arrival date & time: 01/15/20  0856      History   Chief Complaint Chief Complaint  Patient presents with   Diarrhea    HPI Carly Key is a 24 y.o. female.   24 yo female here for evaluation of vomiting, diarrhea, and stomach cramps that started after eating a Chipotle. 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 They ate the same bowl but the patient had guacamole and her sister did not. She had eaten at 8 pm and by 8 am her symptoms had started with diarrhea and then vomiting. She reports that she is not so much nauseated as vomits due to the cramping pain. Her symptoms started 2 days ago, were tolerable the first day, she was able to get through clinicals, but intensified on day two. She is reporting that her stools were every 5-10 minutes, went from liquid stool, to bloody diarrhea, and now is passing bloody mucous. She has not had a fever. She is having lower abdominal cramping. She took Imodium that decreased frequency of stool to every 30 minutes.       Past Medical History:  Diagnosis Date   Migraine     Patient Active Problem List   Diagnosis Date Noted   Low back pain 10/21/2013    Past Surgical History:  Procedure Laterality Date   WISDOM TOOTH EXTRACTION      OB History   No obstetric history on file.      Home Medications    Prior to Admission medications   Medication Sig Start Date End Date Taking? Authorizing Provider  ciprofloxacin (CIPRO) 500 MG tablet Take 1 tablet (500 mg total) by mouth every 12 (twelve) hours for 7 days. 01/15/20 01/22/20  Becky Augusta, NP  dicyclomine (BENTYL) 20 MG tablet Take 1 tablet (20 mg total) by mouth 2 (two) times daily. 01/15/20   Becky Augusta, NP  ondansetron (ZOFRAN ODT) 8 MG disintegrating tablet Take 1 tablet (8 mg total) by mouth every 8 (eight) hours as needed for nausea or vomiting. 01/15/20   Becky Augusta, NP    Family History Family  History  Problem Relation Age of Onset   Diabetes Other    Diabetes Other     Social History Social History   Tobacco Use   Smoking status: Never Smoker   Smokeless tobacco: Never Used  Vaping Use   Vaping Use: Never used  Substance Use Topics   Alcohol use: Yes    Comment: occasinally   Drug use: No     Allergies   Patient has no known allergies.   Review of Systems Review of Systems  Constitutional: Negative for activity change, appetite change and fever.  HENT: Negative for congestion, sinus pressure and sore throat.   Respiratory: Negative for cough, shortness of breath and wheezing.   Cardiovascular: Negative for chest pain.  Gastrointestinal: Positive for abdominal pain, blood in stool, diarrhea and vomiting.  Genitourinary: Negative for dysuria, frequency and urgency.  Musculoskeletal: Negative for back pain.  Skin: Negative.   Neurological: Negative for syncope and headaches.  Hematological: Negative.   Psychiatric/Behavioral: Negative.      Physical Exam Triage Vital Signs ED Triage Vitals  Enc Vitals Group     BP 01/15/20 0915 125/89     Pulse Rate 01/15/20 0915 (!) 112     Resp 01/15/20 0915 18     Temp 01/15/20 0915 99.2 F (  37.3 C)     Temp Source 01/15/20 0915 Oral     SpO2 01/15/20 0915 99 %     Weight 01/15/20 0913 120 lb (54.4 kg)     Height 01/15/20 0913 5\' 3"  (1.6 m)     Head Circumference --      Peak Flow --      Pain Score 01/15/20 0912 7     Pain Loc --      Pain Edu? --      Excl. in GC? --    No data found.  Updated Vital Signs BP 109/73 (BP Location: Left Arm)    Pulse 97    Temp 99.2 F (37.3 C) (Oral)    Resp 14    Ht 5\' 3"  (1.6 m)    Wt 120 lb (54.4 kg)    SpO2 98%    BMI 21.26 kg/m   Visual Acuity Right Eye Distance:   Left Eye Distance:   Bilateral Distance:    Right Eye Near:   Left Eye Near:    Bilateral Near:     Physical Exam Vitals and nursing note reviewed. Exam conducted with a chaperone present  03/16/20, RN).  Constitutional:      General: She is not in acute distress.    Appearance: Normal appearance. She is not toxic-appearing.  HENT:     Head: Normocephalic and atraumatic.     Mouth/Throat:     Mouth: Mucous membranes are moist.     Pharynx: No oropharyngeal exudate or posterior oropharyngeal erythema.     Comments: Oral mucous membranes are sticky.  Eyes:     General: No scleral icterus.    Extraocular Movements: Extraocular movements intact.     Conjunctiva/sclera: Conjunctivae normal.     Pupils: Pupils are equal, round, and reactive to light.  Cardiovascular:     Rate and Rhythm: Normal rate and regular rhythm.     Pulses: Normal pulses.     Heart sounds: Normal heart sounds. No murmur heard.  No gallop.   Pulmonary:     Effort: Pulmonary effort is normal.     Breath sounds: Normal breath sounds. No wheezing, rhonchi or rales.  Abdominal:     General: Abdomen is flat.     Palpations: Abdomen is soft.     Tenderness: There is abdominal tenderness. There is no guarding or rebound.     Comments: Patient has hypoactive bowel sounds in all quads. She exhibits tenderness in the LLQ. NO guarding or rebound.   Genitourinary:    Rectum: Guaiac result positive.  Musculoskeletal:        General: No tenderness. Normal range of motion.     Cervical back: Neck supple.  Lymphadenopathy:     Cervical: No cervical adenopathy.  Skin:    General: Skin is warm and dry.     Capillary Refill: Capillary refill takes less than 2 seconds.     Coloration: Skin is not jaundiced.     Findings: No erythema or rash.  Neurological:     General: No focal deficit present.     Mental Status: She is alert and oriented to person, place, and time.  Psychiatric:        Mood and Affect: Mood normal.        Behavior: Behavior normal.        Thought Content: Thought content normal.        Judgment: Judgment normal.      UC Treatments / Results  Labs (all labs ordered are listed, but  only abnormal results are displayed) Labs Reviewed  OCCULT BLOOD X 1 CARD TO LAB, STOOL - Abnormal; Notable for the following components:      Result Value   Fecal Occult Bld POSITIVE (*)    All other components within normal limits  CBC WITH DIFFERENTIAL/PLATELET - Abnormal; Notable for the following components:   Hemoglobin 15.6 (*)    All other components within normal limits  GASTROINTESTINAL PANEL BY PCR, STOOL (REPLACES STOOL CULTURE)  COMPREHENSIVE METABOLIC PANEL    EKG   Radiology No results found.  Procedures Procedures (including critical care time)  Medications Ordered in UC Medications  sodium chloride 0.9 % bolus 1,000 mL (0 mLs Intravenous Stopped 01/15/20 1143)  ketorolac (TORADOL) 30 MG/ML injection 30 mg (30 mg Intravenous Given 01/15/20 1042)  sodium chloride 0.9 % bolus 1,000 mL (0 mLs Intravenous Stopped 01/15/20 1259)    Initial Impression / Assessment and Plan / UC Course  I have reviewed the triage vital signs and the nursing notes.  Pertinent labs & imaging results that were available during my care of the patient were reviewed by me and considered in my medical decision making (see chart for details).   Patient presents with abdominal cramping, vomiting and diarrhea with the passing of bloody stools after eating out. Her sister ate the same food but did not have guacamole as the patient did.   PE reveals mildly decreased skin turgor but no tenting, her mucous membranes are sticky. Bowel sounds are hypoactive and there is tenderness in the LLQ. Stool guaiac grossly positive for bloody mucous. Suspect food borne illness; most likely salmonella given the acute onset.  Will send Guaiac, give fluid bolus and toradol for cramping. Stool PCR test ordered.   Guaiac positive, CMP and CBC unremarkable. Stool PCR pending.  After 1st liter the patient reports feeling better. Will administer second liter and D/C home.  Will treat with Cipro 500 mg BID x 7 days,  Bentyl 20 mg QID PRN, and Zofran PRN nausea.     Final Clinical Impressions(s) / UC Diagnoses   Final diagnoses:  Diarrhea in adult patient     Discharge Instructions     Take the Cipro twice daily for 7 days. Use the Bentyl every 6 hours as needed for spasms in your stomach and the Zofran every 8 hours as needed for nausea.  Follow a clear liquid diet until your diarrhea has resolved and then slowly advance as tolerated.     ED Prescriptions    Medication Sig Dispense Auth. Provider   ciprofloxacin (CIPRO) 500 MG tablet Take 1 tablet (500 mg total) by mouth every 12 (twelve) hours for 7 days. 14 tablet Becky Augusta, NP   dicyclomine (BENTYL) 20 MG tablet Take 1 tablet (20 mg total) by mouth 2 (two) times daily. 20 tablet Becky Augusta, NP   ondansetron (ZOFRAN ODT) 8 MG disintegrating tablet Take 1 tablet (8 mg total) by mouth every 8 (eight) hours as needed for nausea or vomiting. 20 tablet Becky Augusta, NP     PDMP not reviewed this encounter.   Becky Augusta, NP 01/15/20 1311

## 2020-01-15 NOTE — Discharge Instructions (Addendum)
Take the Cipro twice daily for 7 days. Use the Bentyl every 6 hours as needed for spasms in your stomach and the Zofran every 8 hours as needed for nausea.  Follow a clear liquid diet until your diarrhea has resolved and then slowly advance as tolerated.

## 2020-01-16 LAB — GASTROINTESTINAL PANEL BY PCR, STOOL (REPLACES STOOL CULTURE)
Adenovirus F40/41: NOT DETECTED
Astrovirus: NOT DETECTED
Campylobacter species: NOT DETECTED
Cryptosporidium: NOT DETECTED
Cyclospora cayetanensis: NOT DETECTED
E. coli O157: DETECTED — AB
Entamoeba histolytica: NOT DETECTED
Enteroaggregative E coli (EAEC): NOT DETECTED
Enterotoxigenic E coli (ETEC): NOT DETECTED
Giardia lamblia: NOT DETECTED
Norovirus GI/GII: NOT DETECTED
Plesimonas shigelloides: NOT DETECTED
Rotavirus A: NOT DETECTED
Salmonella species: NOT DETECTED
Sapovirus (I, II, IV, and V): NOT DETECTED
Shiga like toxin producing E coli (STEC): DETECTED — AB
Shigella/Enteroinvasive E coli (EIEC): NOT DETECTED
Vibrio cholerae: NOT DETECTED
Vibrio species: NOT DETECTED
Yersinia enterocolitica: NOT DETECTED

## 2020-02-04 ENCOUNTER — Ambulatory Visit: Payer: No Typology Code available for payment source | Attending: Internal Medicine

## 2020-02-04 ENCOUNTER — Other Ambulatory Visit (HOSPITAL_BASED_OUTPATIENT_CLINIC_OR_DEPARTMENT_OTHER): Payer: Self-pay | Admitting: Internal Medicine

## 2020-02-04 DIAGNOSIS — Z23 Encounter for immunization: Secondary | ICD-10-CM

## 2020-02-04 NOTE — Progress Notes (Signed)
   Covid-19 Vaccination Clinic  Name:  Carly Key    MRN: [id number] DOB: Jun 20, 1995  02/04/2020  Ms. Carly Key was observed post Covid-19 immunization for 15 minutes without incident. She was provided with Vaccine Information Sheet and instruction to access the V-Safe system.   Ms. Carly Key was instructed to call [phone] with any severe reactions post vaccine: Marland Kitchen Difficulty breathing  . Swelling of face and throat  . A fast heartbeat  . A bad rash all over body  . Dizziness and weakness

## 2020-02-08 MED FILL — MODERNA COVID-19 VACCINE 10: 100 | 1 days supply | Qty: 0 | Fill #0

## 2020-05-12 ENCOUNTER — Other Ambulatory Visit (HOSPITAL_BASED_OUTPATIENT_CLINIC_OR_DEPARTMENT_OTHER): Payer: Self-pay | Admitting: Obstetrics

## 2020-05-12 DIAGNOSIS — Z1159 Encounter for screening for other viral diseases: Secondary | ICD-10-CM | POA: Diagnosis not present

## 2020-05-12 DIAGNOSIS — Z118 Encounter for screening for other infectious and parasitic diseases: Secondary | ICD-10-CM | POA: Diagnosis not present

## 2020-05-12 DIAGNOSIS — Z114 Encounter for screening for human immunodeficiency virus [HIV]: Secondary | ICD-10-CM | POA: Diagnosis not present

## 2020-05-12 DIAGNOSIS — Z6824 Body mass index (BMI) 24.0-24.9, adult: Secondary | ICD-10-CM | POA: Diagnosis not present

## 2020-05-12 DIAGNOSIS — Z01419 Encounter for gynecological examination (general) (routine) without abnormal findings: Secondary | ICD-10-CM | POA: Diagnosis not present

## 2020-05-12 DIAGNOSIS — Z124 Encounter for screening for malignant neoplasm of cervix: Secondary | ICD-10-CM | POA: Diagnosis not present

## 2020-05-12 DIAGNOSIS — Z3044 Encounter for surveillance of vaginal ring hormonal contraceptive device: Secondary | ICD-10-CM | POA: Diagnosis not present

## 2020-05-12 DIAGNOSIS — Z113 Encounter for screening for infections with a predominantly sexual mode of transmission: Secondary | ICD-10-CM | POA: Diagnosis not present

## 2020-05-19 MED FILL — ETONOGESTREL-ETHINYL ESTRAD: 0.12-0.015 | 84 days supply | Qty: 3 | Fill #0

## 2020-10-02 ENCOUNTER — Other Ambulatory Visit (HOSPITAL_BASED_OUTPATIENT_CLINIC_OR_DEPARTMENT_OTHER): Payer: Self-pay

## 2020-10-02 MED FILL — Etonogestrel-Ethinyl Estradiol VA Ring 0.12-0.015 MG/24HR: VAGINAL | 84 days supply | Qty: 3 | Fill #0 | Status: AC

## 2021-01-01 ENCOUNTER — Other Ambulatory Visit (HOSPITAL_BASED_OUTPATIENT_CLINIC_OR_DEPARTMENT_OTHER): Payer: Self-pay

## 2021-01-01 MED FILL — Etonogestrel-Ethinyl Estradiol VA Ring 0.12-0.015 MG/24HR: VAGINAL | 84 days supply | Qty: 3 | Fill #1 | Status: AC

## 2021-04-10 DIAGNOSIS — H5213 Myopia, bilateral: Secondary | ICD-10-CM | POA: Diagnosis not present

## 2021-07-12 ENCOUNTER — Other Ambulatory Visit: Payer: Self-pay

## 2021-07-12 ENCOUNTER — Other Ambulatory Visit (HOSPITAL_BASED_OUTPATIENT_CLINIC_OR_DEPARTMENT_OTHER): Payer: Self-pay

## 2021-07-12 MED ORDER — ETONOGESTREL-ETHINYL ESTRADIOL 0.12-0.015 MG/24HR VA RING
VAGINAL_RING | VAGINAL | 0 refills | Status: DC
  Filled 2021-07-12: qty 1, 30d supply, fill #0

## 2021-07-13 ENCOUNTER — Other Ambulatory Visit (HOSPITAL_BASED_OUTPATIENT_CLINIC_OR_DEPARTMENT_OTHER): Payer: Self-pay

## 2021-07-30 DIAGNOSIS — Z118 Encounter for screening for other infectious and parasitic diseases: Secondary | ICD-10-CM | POA: Diagnosis not present

## 2021-07-30 DIAGNOSIS — Z6822 Body mass index (BMI) 22.0-22.9, adult: Secondary | ICD-10-CM | POA: Diagnosis not present

## 2021-07-30 DIAGNOSIS — Z01419 Encounter for gynecological examination (general) (routine) without abnormal findings: Secondary | ICD-10-CM | POA: Diagnosis not present

## 2021-08-13 ENCOUNTER — Other Ambulatory Visit (HOSPITAL_BASED_OUTPATIENT_CLINIC_OR_DEPARTMENT_OTHER): Payer: Self-pay

## 2021-08-13 MED ORDER — ETONOGESTREL-ETHINYL ESTRADIOL 0.12-0.015 MG/24HR VA RING: VAGINAL_RING | VAGINAL | 4 refills | Status: AC

## 2021-08-13 MED ORDER — COVID-19 AT HOME ANTIGEN TEST VI KIT
PACK | 0 refills | Status: AC
  Filled 2021-08-13: qty 4, 8d supply, fill #0

## 2021-10-26 ENCOUNTER — Other Ambulatory Visit (HOSPITAL_BASED_OUTPATIENT_CLINIC_OR_DEPARTMENT_OTHER): Payer: Self-pay
